# Patient Record
Sex: Female | Born: 1970 | Race: Asian | Hispanic: No | Marital: Married | Smoking: Never smoker
Health system: Southern US, Community
[De-identification: ages and names within clinical notes are randomized; demographics above are authoritative.]

## PROBLEM LIST (undated history)

## (undated) DIAGNOSIS — I10 Essential (primary) hypertension: Secondary | ICD-10-CM

---

## 2019-01-19 ENCOUNTER — Emergency Department (HOSPITAL_COMMUNITY): Payer: BC Managed Care – PPO

## 2019-01-19 ENCOUNTER — Encounter (HOSPITAL_COMMUNITY): Payer: Self-pay | Admitting: Family Medicine

## 2019-01-19 ENCOUNTER — Observation Stay (HOSPITAL_COMMUNITY)
Admission: EM | Admit: 2019-01-19 | Discharge: 2019-01-20 | Disposition: A | Discharge status: Home / Self Care | Payer: BC Managed Care – PPO | Attending: Internal Medicine | Admitting: Internal Medicine

## 2019-01-19 ENCOUNTER — Other Ambulatory Visit: Payer: Self-pay

## 2019-01-19 DIAGNOSIS — D649 Anemia, unspecified: Principal | ICD-10-CM | POA: Diagnosis present

## 2019-01-19 DIAGNOSIS — I10 Essential (primary) hypertension: Secondary | ICD-10-CM | POA: Diagnosis present

## 2019-01-19 DIAGNOSIS — Z20828 Contact with and (suspected) exposure to other viral communicable diseases: Secondary | ICD-10-CM | POA: Diagnosis not present

## 2019-01-19 DIAGNOSIS — R002 Palpitations: Secondary | ICD-10-CM | POA: Diagnosis not present

## 2019-01-19 DIAGNOSIS — N920 Excessive and frequent menstruation with regular cycle: Secondary | ICD-10-CM | POA: Diagnosis present

## 2019-01-19 HISTORY — DX: Essential (primary) hypertension: I10

## 2019-01-19 LAB — CBC
HCT: 22.7 % — ABNORMAL LOW (ref 36.0–46.0)
Hemoglobin: 5.4 g/dL — CL (ref 12.0–15.0)
MCH: 14.2 pg — ABNORMAL LOW (ref 26.0–34.0)
MCHC: 23.8 g/dL — ABNORMAL LOW (ref 30.0–36.0)
MCV: 59.9 fL — ABNORMAL LOW (ref 80.0–100.0)
Platelets: 557 10*3/uL — ABNORMAL HIGH (ref 150–400)
RBC: 3.79 MIL/uL — ABNORMAL LOW (ref 3.87–5.11)
RDW: 23.1 % — ABNORMAL HIGH (ref 11.5–15.5)
WBC: 8.8 10*3/uL (ref 4.0–10.5)
nRBC: 0.2 % (ref 0.0–0.2)

## 2019-01-19 LAB — COMPREHENSIVE METABOLIC PANEL
ALT: 13 U/L (ref 0–44)
AST: 15 U/L (ref 15–41)
Albumin: 3.7 g/dL (ref 3.5–5.0)
Alkaline Phosphatase: 40 U/L (ref 38–126)
Anion gap: 11 (ref 5–15)
BUN: 11 mg/dL (ref 6–20)
CO2: 21 mmol/L — ABNORMAL LOW (ref 22–32)
Calcium: 8.7 mg/dL — ABNORMAL LOW (ref 8.9–10.3)
Chloride: 104 mmol/L (ref 98–111)
Creatinine, Ser: 0.45 mg/dL (ref 0.44–1.00)
GFR calc Af Amer: >60 mL/min (ref >60)
GFR calc non Af Amer: >60 mL/min (ref >60)
Glucose, Bld: 111 mg/dL — ABNORMAL HIGH (ref 70–99)
Potassium: 3.5 mmol/L (ref 3.5–5.1)
Sodium: 136 mmol/L (ref 135–145)
Total Bilirubin: 0.6 mg/dL (ref 0.3–1.2)
Total Protein: 7 g/dL (ref 6.5–8.1)

## 2019-01-19 LAB — PREPARE RBC (CROSSMATCH)

## 2019-01-19 LAB — RETICULOCYTES
Immature Retic Fract: 29.6 % — ABNORMAL HIGH (ref 2.3–15.9)
RBC.: 3.72 MIL/uL — ABNORMAL LOW (ref 3.87–5.11)
Retic Count, Absolute: 68.1 10*3/uL (ref 19.0–186.0)
Retic Ct Pct: 1.8 % (ref 0.4–3.1)

## 2019-01-19 LAB — SARS CORONAVIRUS 2 (TAT 6-24 HRS): SARS Coronavirus 2: NEGATIVE

## 2019-01-19 LAB — TROPONIN I (HIGH SENSITIVITY)
Troponin I (High Sensitivity): <2 ng/L (ref <18)
Troponin I (High Sensitivity): <2 ng/L (ref <18)

## 2019-01-19 LAB — ABO/RH: ABO/RH(D): O POS

## 2019-01-19 LAB — IRON AND TIBC
Iron: 9 ug/dL — ABNORMAL LOW (ref 28–170)
Saturation Ratios: 2 % — ABNORMAL LOW (ref 10.4–31.8)
TIBC: 500 ug/dL — ABNORMAL HIGH (ref 250–450)
UIBC: 491 ug/dL

## 2019-01-19 LAB — FOLATE: Folate: 14.5 ng/mL (ref >5.9)

## 2019-01-19 LAB — FERRITIN: Ferritin: 2 ng/mL — ABNORMAL LOW (ref 11–307)

## 2019-01-19 LAB — VITAMIN B12: Vitamin B-12: 419 pg/mL (ref 180–914)

## 2019-01-19 LAB — POC OCCULT BLOOD, ED: Fecal Occult Bld: NEGATIVE

## 2019-01-19 MED ORDER — ONDANSETRON HCL 4 MG/2ML IJ SOLN: 4.0000 mg | Freq: Four times a day (QID) | INTRAMUSCULAR | Status: DC | PRN

## 2019-01-19 MED ORDER — SODIUM CHLORIDE 0.9% FLUSH: 3.0000 mL | INTRAVENOUS | Status: DC | PRN

## 2019-01-19 MED ORDER — SODIUM CHLORIDE 0.9% FLUSH: 3.0000 mL | Freq: Two times a day (BID) | INTRAVENOUS | Status: DC

## 2019-01-19 MED ORDER — ACETAMINOPHEN 650 MG RE SUPP: 650.0000 mg | Freq: Four times a day (QID) | RECTAL | Status: DC | PRN

## 2019-01-19 MED ORDER — SODIUM CHLORIDE 0.9 % IV SOLN: 250.0000 mL | INTRAVENOUS | Status: DC | PRN

## 2019-01-19 MED ORDER — ONDANSETRON HCL 4 MG PO TABS: 4.0000 mg | ORAL_TABLET | Freq: Four times a day (QID) | ORAL | Status: DC | PRN

## 2019-01-19 MED ORDER — POLYETHYLENE GLYCOL 3350 17 G PO PACK: 17.0000 g | PACK | Freq: Every day | ORAL | Status: DC | PRN

## 2019-01-19 MED ORDER — SODIUM CHLORIDE 0.9 % IV SOLN
10.0000 mL/h | Freq: Once | INTRAVENOUS | Status: AC
  Administered 2019-01-19: 10 mL/h via INTRAVENOUS

## 2019-01-19 MED ORDER — ACETAMINOPHEN 325 MG PO TABS: 650.0000 mg | ORAL_TABLET | Freq: Four times a day (QID) | ORAL | Status: DC | PRN

## 2019-01-19 NOTE — ED Notes (Signed)
Electronic Blood Consent signed on the computer by the patient.

## 2019-01-19 NOTE — H&P (Signed)
History and Physical    Rita Green [id number] DOB: Dec 04, 1970 DOA: 01/19/2019  PCP: Leonard Downing, MD   Patient coming from: Home   Chief Complaint: Palpitations, low Hgb on outpatient labs   HPI: Rita Green is a 48 y.o. female with medical history significant for hypertension and menorrhagia, now presenting to the emergency department for evaluation of low hemoglobin on outpatient blood work.  Patient reports that she has developed palpitations with exertion over the past couple months and saw her PCP for this.  She was evaluated with routine blood work that revealed a hemoglobin in the 5 range.  She denies any history of melena, hematochezia, abdominal discomfort, change in bowel habits, or nausea.  She reports history of menorrhagia for which she was just recently placed on a different oral contraceptive.  She has not had any fevers, chills, cough, or shortness of breath.  She denies lightheadedness.  ED Course: Upon arrival to the ED, patient is found to be afebrile, saturating well on room air, and with stable blood pressure.  EKG features a sinus rhythm and chest x-ray is negative for edema or consolidation.  Troponin is undetectable.  Chemistry panel unremarkable.  Microcytic anemia with hemoglobin 5.4 and MCV 59.9, as well as thrombocytosis to 557,000.  Fecal occult blood testing negative.  CBC notable for COVID-19 testing is in process.  Type and screen was performed and 2 units of packed red blood cells were ordered for immediate transfusion.  Review of Systems:  All other systems reviewed and apart from HPI, are negative.  Past Medical History:  Diagnosis Date  . Hypertension     History reviewed. No pertinent surgical history.   reports that she has never smoked. She has never used smokeless tobacco. She reports previous alcohol use. She reports that she does not use drugs.  No Known Allergies  Family History  Problem Relation Age of Onset  .  Cancer Neg Hx      Prior to Admission medications   Not on File    Physical Exam: Vitals:   01/19/19 1901 01/19/19 1942 01/19/19 1945 01/19/19 1957  BP:  121/76 121/71 126/70  Pulse: 89 86 78 83  Resp: (!) 21 20 20  (!) 22  Temp:  98.5 F (36.9 C)  98.6 F (37 C)  TempSrc:  Oral  Oral  SpO2: 100%  100% 100%    Constitutional: NAD, calm, pale  Eyes: PERTLA, lids and conjunctivae normal ENMT: Mucous membranes are moist. Posterior pharynx clear of any exudate or lesions.   Neck: normal, supple, no masses, no thyromegaly Respiratory:  no wheezing, no crackles. Normal respiratory effort. No accessory muscle use.  Cardiovascular: S1 & S2 heard, regular rate and rhythm. No extremity edema.   Abdomen: No distension, no tenderness, soft. Bowel sounds normal.  Musculoskeletal: no clubbing / cyanosis. No joint deformity upper and lower extremities.    Skin: no significant rashes, lesions, ulcers. Warm, dry, well-perfused. Neurologic: No facial asymmetry. Sensation intact. Moving all extremities.  Psychiatric: Alert and oriented x 3. Pleasant, cooperative.     Labs on Admission: I have personally reviewed following labs and imaging studies  CBC: Recent Labs  Lab 01/19/19 1535  WBC 8.8  HGB 5.4*  HCT 22.7*  MCV 59.9*  PLT 123XX123*   Basic Metabolic Panel: Recent Labs  Lab 01/19/19 1535  NA 136  K 3.5  CL 104  CO2 21*  GLUCOSE 111*  BUN 11  CREATININE 0.45  CALCIUM  8.7*   GFR: CrCl cannot be calculated (Unknown ideal weight.). Liver Function Tests: Recent Labs  Lab 01/19/19 1535  AST 15  ALT 13  ALKPHOS 40  BILITOT 0.6  PROT 7.0  ALBUMIN 3.7   No results for input(s): LIPASE, AMYLASE in the last 168 hours. No results for input(s): AMMONIA in the last 168 hours. Coagulation Profile: No results for input(s): INR, PROTIME in the last 168 hours. Cardiac Enzymes: No results for input(s): CKTOTAL, CKMB, CKMBINDEX, TROPONINI in the last 168 hours. BNP (last 3  results) No results for input(s): PROBNP in the last 8760 hours. HbA1C: No results for input(s): HGBA1C in the last 72 hours. CBG: No results for input(s): GLUCAP in the last 168 hours. Lipid Profile: No results for input(s): CHOL, HDL, LDLCALC, TRIG, CHOLHDL, LDLDIRECT in the last 72 hours. Thyroid Function Tests: No results for input(s): TSH, T4TOTAL, FREET4, T3FREE, THYROIDAB in the last 72 hours. Anemia Panel: No results for input(s): VITAMINB12, FOLATE, FERRITIN, TIBC, IRON, RETICCTPCT in the last 72 hours. Urine analysis: No results found for: COLORURINE, APPEARANCEUR, LABSPEC, PHURINE, GLUCOSEU, HGBUR, BILIRUBINUR, KETONESUR, PROTEINUR, UROBILINOGEN, NITRITE, LEUKOCYTESUR Sepsis Labs: @LABRCNTIP (procalcitonin:4,lacticidven:4) )No results found for this or any previous visit (from the past 240 hour(s)).   Radiological Exams on Admission: Dg Chest 2 View  Result Date: 01/19/2019 CLINICAL DATA:  Cardiac palpitations and anemia EXAM: CHEST - 2 VIEW COMPARISON:  None. FINDINGS: Lungs are clear. Heart is upper normal in size with pulmonary vascularity normal. No adenopathy. No bone lesions. IMPRESSION: No edema or consolidation.  Heart upper normal in size. Electronically Signed   By: Lowella Grip III M.D.   On: 01/19/2019 16:30    EKG: Independently reviewed. Sinus rhythm, rate 82, QTc 425 ms.   Assessment/Plan   1. Symptomatic anemia  - Presents with 2 months of palpitations and low Hgb on outpatient blood work, found to have Hgb of 5.4 in ED with MCV 55.9  - Patient denies melena or hematochezia and FOBT is negative  - She reports hx of menorrhagia and was just started on an OCP for this  - 2 units RBC ordered from ED and first is transfusing  - Add anemia panel to initial blood draw, check post-transfusion CBC    2. Hypertension  - Patient unsure which antihypertensive she takes  - BP at goal, treat as-needed for now    PPE: Mask, face shield  DVT prophylaxis:  SCD's Code Status: Full  Family Communication: Discussed with patient  Consults called:  None  Admission status: Observation    Vianne Bulls, MD [facility] Pager [phone]  If 7PM-7AM, please contact night-coverage [email or web address] Password [email or web address]  01/19/2019, 8:01 PM

## 2019-01-19 NOTE — ED Provider Notes (Signed)
[facility] EMERGENCY DEPARTMENT Provider Note   CSN: [id number] Arrival date & time: 01/19/19  1510    History   Chief Complaint Chief Complaint  Patient presents with  . Abnormal Lab   HPI Rita Green is a 48 y.o. female with no significant past medical history how presents for palpitations. Seen by PCP yesterday and had labs done. Called today for anemia. Patient denies melena or hematochezia.  States she has had some mild intermittent dizziness over the last week.  She did recently's complete her menstrual cycle.  She does admit to heavy menstrual bleeding however her menstrual cycle been completed for about 1 week.  She has not had to take any medications for this.  States she does occasionally get palpitations however this has been occurring x1 year.  She was seen yesterday for her physical when she was noted to have this lab abnormality.  She does take medications for high blood pressure however does not any other additional chronic medical problems.  Denies headache, dizziness, lightheadedness, congestion, rhinorrhea, stiff neck chest pain, shortness of breath, reflux, dysuria, diarrhea, constipation.  Denies additional aggravating or alleviating factors.  She has a history of anemia however has never required a blood transfusion.  History obtained from patient, patient's son in room. No prior visits in Epic to compare. Patient refuses medical [place] interpreter and prefers her son to interpret.     PCP--Dr. Arelia Sneddon, [place]  HPI  No past medical history on file.  Patient Active Problem List   Diagnosis Date Noted  . Symptomatic anemia 01/19/2019    History reviewed   OB History   No obstetric history on file.      Home Medications    Prior to Admission medications   Not on File    Family History No family history on file.  Social History Social History   Tobacco Use  . Smoking status: Not on file  Substance Use Topics   . Alcohol use: Not on file  . Drug use: Not on file   Allergies   Patient has no known allergies.   Review of Systems Review of Systems  Constitutional: Negative.   HENT: Negative.   Respiratory: Negative.   Cardiovascular: Negative.   Gastrointestinal: Negative.   Genitourinary: Negative.   Musculoskeletal: Negative.   Skin: Negative.   Neurological: Negative.  Dizziness: Intermittent, None currently.  All other systems reviewed and are negative.  Physical Exam Updated Vital Signs BP 121/76   Pulse 86   Temp 98.5 F (36.9 C) (Oral)   Resp 20   LMP 12/28/2018 (Exact Date)   SpO2 100%   Physical Exam Vitals signs and nursing note reviewed. Exam conducted with a chaperone present.  Constitutional:      General: She is not in acute distress.    Appearance: She is well-developed. She is not ill-appearing, toxic-appearing or diaphoretic.  HENT:     Head: Normocephalic and atraumatic.  Eyes:     Pupils: Pupils are equal, round, and reactive to light.     Comments: Pale conjunctiva  Neck:     Musculoskeletal: Normal range of motion.  Cardiovascular:     Rate and Rhythm: Normal rate.     Pulses: Normal pulses.     Heart sounds: Normal heart sounds.  Pulmonary:     Effort: Pulmonary effort is normal. No respiratory distress.     Breath sounds: Normal breath sounds and air entry.  Abdominal:     General:  Bowel sounds are normal. There is no distension.     Palpations: Abdomen is soft.     Tenderness: There is no abdominal tenderness. There is no right CVA tenderness, left CVA tenderness, guarding or rebound.  Genitourinary:    Comments: Normal rectal tone. No stool in rectal vault. No gross blood Musculoskeletal: Normal range of motion.     Comments: Moves all 4 extremities without difficulty  Skin:    General: Skin is warm and dry.  Neurological:     Mental Status: She is alert.     Cranial Nerves: Cranial nerves are intact.     Sensory: Sensation is intact.      Motor: Motor function is intact.     Gait: Gait is intact.     Comments: Cn 2-12 grossly intact. No facial droop. Ambulatory in room without difficulty.      ED Treatments / Results  Labs (all labs ordered are listed, but only abnormal results are displayed) Labs Reviewed  COMPREHENSIVE METABOLIC PANEL - Abnormal; Notable for the following components:      Result Value   CO2 21 (*)    Glucose, Bld 111 (*)    Calcium 8.7 (*)    All other components within normal limits  CBC - Abnormal; Notable for the following components:   RBC 3.79 (*)    Hemoglobin 5.4 (*)    HCT 22.7 (*)    MCV 59.9 (*)    MCH 14.2 (*)    MCHC 23.8 (*)    RDW 23.1 (*)    Platelets 557 (*)    All other components within normal limits  SARS CORONAVIRUS 2 (TAT 6-24 HRS)  VITAMIN B12  FOLATE  IRON AND TIBC  FERRITIN  RETICULOCYTES  POC OCCULT BLOOD, ED  TYPE AND SCREEN  ABO/RH  PREPARE RBC (CROSSMATCH)  TROPONIN I (HIGH SENSITIVITY)  TROPONIN I (HIGH SENSITIVITY)    EKG None  Radiology Dg Chest 2 View  Result Date: 01/19/2019 CLINICAL DATA:  Cardiac palpitations and anemia EXAM: CHEST - 2 VIEW COMPARISON:  None. FINDINGS: Lungs are clear. Heart is upper normal in size with pulmonary vascularity normal. No adenopathy. No bone lesions. IMPRESSION: No edema or consolidation.  Heart upper normal in size. Electronically Signed   By: Lowella Grip III M.D.   On: 01/19/2019 16:30    Procedures .Critical Care Performed by: Nettie Elm, PA-C Authorized by: Nettie Elm, PA-C   Critical care provider statement:    Critical care time (minutes):  45   Critical care was necessary to treat or prevent imminent or life-threatening deterioration of the following conditions:  Circulatory failure (Anemia requiring transfusion)   Critical care was time spent personally by me on the following activities:  Discussions with consultants, evaluation of patient's response to treatment, examination of  patient, ordering and performing treatments and interventions, ordering and review of laboratory studies, ordering and review of radiographic studies, pulse oximetry, re-evaluation of patient's condition, obtaining history from patient or surrogate and review of old charts   (including critical care time)  Medications Ordered in ED Medications  0.9 %  sodium chloride infusion (has no administration in time range)   Initial Impression / Assessment and Plan / ED Course  I have reviewed the triage vital signs and the nursing notes.  Pertinent labs & imaging results that were available during my care of the patient were reviewed by me and considered in my medical decision making (see chart for details).  [place]  year old presents for evaluation of abnormal labs. Afebrile, non septic, non ill appearing. Patient with anemia of Hgb at 5.1. No gross blood on rectal exam. Hx of heavy menstrual bleeding which recently completed menstrual cycle. Denies active vaginal bleeding. Mild conjunctival pallor. Hx of intermittent palpitations over the last year. No current CP, SOB, hemoptysis. Labs obtained from triage.  CBC with anemia CMP with mildly elevated glucose Trop negative EKG without acute St.T changes. No STEMI DG chest without acute findings Occult negative DG chest without acute findings  Heart score 1- Acute, PERC negative, Wells criteria low risk. Low suspicion for ACS, PE, dissection, myocarditis, pericarditis, acute bacterial infectious process as cause of patient's palpitations.  She has had no arrhythmias or ectopy while in the emergency department.   Patient will need admission for anemia. Discussed risk vs benefit of transfusion. Patient voiced understanding and would like to processed with transfusion. Likely source from dysfunctional uterine bleeding however no current bleeding  CONSULT: Dr. Myna Hidalgo with [facility] who will evaluate patient for admission.   The patient appears reasonably  stabilized for admission considering the current resources, flow, and capabilities available in the ED at this time, and I doubt any other [facility] requiring further screening and/or treatment in the ED prior to admission.       Final Clinical Impressions(s) / ED Diagnoses   Final diagnoses:  Anemia, unspecified type    ED Discharge Orders    None       Kristal Perl A, PA-C 01/19/19 1946    Isla Pence, MD 01/19/19 1955

## 2019-01-19 NOTE — ED Triage Notes (Signed)
Pt here, sent by PCP for low hgb (5). Pt went to PCP for palpitations and was found in blood work done yesterday. Pt has not noticed any blood in stool.

## 2019-01-20 DIAGNOSIS — D649 Anemia, unspecified: Secondary | ICD-10-CM | POA: Diagnosis not present

## 2019-01-20 LAB — TYPE AND SCREEN
ABO/RH(D): O POS
Antibody Screen: NEGATIVE
Unit division: 0
Unit division: 0

## 2019-01-20 LAB — BASIC METABOLIC PANEL
Anion gap: 7 (ref 5–15)
BUN: 7 mg/dL (ref 6–20)
CO2: 22 mmol/L (ref 22–32)
Calcium: 8.5 mg/dL — ABNORMAL LOW (ref 8.9–10.3)
Chloride: 109 mmol/L (ref 98–111)
Creatinine, Ser: 0.46 mg/dL (ref 0.44–1.00)
GFR calc Af Amer: >60 mL/min (ref >60)
GFR calc non Af Amer: >60 mL/min (ref >60)
Glucose, Bld: 93 mg/dL (ref 70–99)
Potassium: 3.8 mmol/L (ref 3.5–5.1)
Sodium: 138 mmol/L (ref 135–145)

## 2019-01-20 LAB — CBC
HCT: 29.2 % — ABNORMAL LOW (ref 36.0–46.0)
Hemoglobin: 8.2 g/dL — ABNORMAL LOW (ref 12.0–15.0)
MCH: 18.1 pg — ABNORMAL LOW (ref 26.0–34.0)
MCHC: 28.1 g/dL — ABNORMAL LOW (ref 30.0–36.0)
MCV: 64.6 fL — ABNORMAL LOW (ref 80.0–100.0)
Platelets: 486 10*3/uL — ABNORMAL HIGH (ref 150–400)
RBC: 4.52 MIL/uL (ref 3.87–5.11)
RDW: 28.9 % — ABNORMAL HIGH (ref 11.5–15.5)
WBC: 8.6 10*3/uL (ref 4.0–10.5)
nRBC: 0 % (ref 0.0–0.2)

## 2019-01-20 LAB — BPAM RBC
Blood Product Expiration Date: 202012232359
Blood Product Expiration Date: 202012232359
ISSUE DATE / TIME: 202011171934
ISSUE DATE / TIME: 202011172202
Unit Type and Rh: 5100
Unit Type and Rh: 5100

## 2019-01-20 LAB — HIV ANTIBODY (ROUTINE TESTING W REFLEX): HIV Screen 4th Generation wRfx: NONREACTIVE

## 2019-01-20 MED ORDER — IRON (FERROUS SULFATE) 325 (65 FE) MG PO TABS: 1.0000 | ORAL_TABLET | Freq: Every day | ORAL | 1 refills | Status: DC

## 2019-01-20 NOTE — ED Notes (Signed)
Breakfast Ordered 

## 2019-01-20 NOTE — Discharge Summary (Addendum)
Physician Discharge Summary  [name] [name] [id number] DOB: 11-08-1970 DOA: 01/19/2019  PCP: Leonard Downing, MD  Admit date: 01/19/2019 Discharge date: 01/20/2019  Time spent: 45 minutes  Recommendations for Outpatient Follow-up:  Follow up with Dr Arelia Sneddon in 5-7 days for evaluation of Hg Follow up with OB-gyn as scheduled Take medications as prescribed.    Discharge Diagnoses:  Principal Problem:   Symptomatic anemia Active Problems:   Essential hypertension   Menorrhagia   Discharge Condition: stable  Diet recommendation: heart healthy  There were no vitals filed for this visit.  History of present illness:  Rita Green is a 48 y.o. female with medical history significant for hypertension and menorrhagia, presented  to the emergency department 11/17 for evaluation of low hemoglobin on outpatient blood work.  Patient reported that she had developed palpitations with exertion over the previous couple months and saw her PCP.  She was evaluated with routine blood work that revealed a hemoglobin in the 5 range.  She denied any history of melena, hematochezia, abdominal discomfort, change in bowel habits, or nausea.  She reported history of menorrhagia for which she was just recently prescribed a different oral contraceptive but had not started.   She denied fevers, chills, cough, or shortness of breath.  She denied lightheadedness  Hospital Course:  1. Symptomatic anemia  - Presented with 2 months of palpitations and low Hgb on outpatient blood work, found to have Hgb of 5.4 in ED with MCV 55.9. Patient denied melena or hematochezia and FOBT is negative. She reported hx of menorrhagia and was just prescribed a different OCP for this but has not started it. She was provided with 2 units RBC and Hg up to 8.2 at discharge. Anemia panel with RBC 3.72, Ferritin 2, iron 9, TIBC 500 saturation ratio 2. Will be discharged with iron supplement. Instructed to follow up with  PCP 5-7 days for Hg check.   2. Hypertension controlled. Home meds include lisinopril and HCTZ. Have instructed her to resume home meds at discharge. SBP 143 upon standing.      Discharge Exam: Vitals:   01/20/19 0815 01/20/19 0830  BP: 111/70 (!) 116/99  Pulse:    Resp: (!) 22 17  Temp:    SpO2:      General: awake alert no acute distress Cardiovascular: rrr no mgr no LE edema Respiratory: normal effort BS clear bilaterally no wheeze Abd: non-distended non-tender +BS no guarding or rebounding  Discharge Instructions   Discharge Instructions     Call MD for:  difficulty breathing, headache or visual disturbances   Complete by: As directed    Call MD for:  extreme fatigue   Complete by: As directed    Call MD for:  persistant dizziness or light-headedness   Complete by: As directed    Call MD for:  severe uncontrolled pain   Complete by: As directed    Call MD for:  temperature >100.4   Complete by: As directed    Diet - low sodium heart healthy   Complete by: As directed    Discharge instructions   Complete by: As directed    Follow up with dr Arelia Sneddon 7 days with cbc to check Hg Take medications as prescribed May return to work 11/21   Increase activity slowly   Complete by: As directed       Allergies as of 01/20/2019   No Known Allergies      Medication List  TAKE these medications    Brimonidine Tartrate 0.025 % Soln Place 1 drop into the right eye daily as needed (For dry eyes).   Iron (Ferrous Sulfate) 325 (65 Fe) MG Tabs Take 1 tablet by mouth daily.   lisinopril-hydrochlorothiazide 20-25 MG tablet Commonly known as: ZESTORETIC Take 1 tablet by mouth daily.   norethindrone 0.35 MG tablet Commonly known as: MICRONOR Take 1 tablet by mouth daily.   REDNESS RELIEF OP Place 1 drop into both eyes daily as needed (For redness in eyes).       No Known Allergies     The results of significant diagnostics from this hospitalization  (including imaging, microbiology, ancillary and laboratory) are listed below for reference.    Significant Diagnostic Studies: Dg Chest 2 View  Result Date: 01/19/2019 CLINICAL DATA:  Cardiac palpitations and anemia EXAM: CHEST - 2 VIEW COMPARISON:  None. FINDINGS: Lungs are clear. Heart is upper normal in size with pulmonary vascularity normal. No adenopathy. No bone lesions. IMPRESSION: No edema or consolidation.  Heart upper normal in size. Electronically Signed   By: Lowella Grip III M.D.   On: 01/19/2019 16:30    Microbiology: Recent Results (from the past 240 hour(s))  SARS CORONAVIRUS 2 (TAT 6-24 HRS) Nasopharyngeal Nasopharyngeal Swab     Status: None   Collection Time: 01/19/19  5:56 PM   Specimen: Nasopharyngeal Swab  Result Value Ref Range Status   SARS Coronavirus 2 NEGATIVE NEGATIVE Final    Comment: (NOTE) SARS-CoV-2 target nucleic acids are NOT DETECTED. The SARS-CoV-2 RNA is generally detectable in upper and lower respiratory specimens during the acute phase of infection. Negative results do not preclude SARS-CoV-2 infection, do not rule out co-infections with other pathogens, and should not be used as the sole basis for treatment or other patient management decisions. Negative results must be combined with clinical observations, patient history, and epidemiological information. The expected result is Negative. Fact Sheet for Patients: SugarRoll.be Fact Sheet for Healthcare Providers: https://www.woods-mathews.com/ This test is not yet approved or cleared by the [place] FDA and  has been authorized for detection and/or diagnosis of SARS-CoV-2 by FDA under an Emergency Use Authorization (EUA). This EUA will remain  in effect (meaning this test can be used) for the duration of the COVID-19 declaration under Section 56 4(b)(1) of the Act, 21 U.S.C. section 360bbb-3(b)(1), unless the authorization is terminated  or revoked sooner. Performed at [facility], [address], [place], [place] [address]      Labs: Basic Metabolic Panel: Recent Labs  Lab 01/19/19 1535 01/20/19 0341  NA 136 138  K 3.5 3.8  CL 104 109  CO2 21* 22  GLUCOSE 111* 93  BUN 11 7  CREATININE 0.45 0.46  CALCIUM 8.7* 8.5*   Liver Function Tests: Recent Labs  Lab 01/19/19 1535  AST 15  ALT 13  ALKPHOS 40  BILITOT 0.6  PROT 7.0  ALBUMIN 3.7   No results for input(s): LIPASE, AMYLASE in the last 168 hours. No results for input(s): AMMONIA in the last 168 hours. CBC: Recent Labs  Lab 01/19/19 1535 01/20/19 0341  WBC 8.8 8.6  HGB 5.4* 8.2*  HCT 22.7* 29.2*  MCV 59.9* 64.6*  PLT 557* 486*   Cardiac Enzymes: No results for input(s): CKTOTAL, CKMB, CKMBINDEX, TROPONINI in the last 168 hours. BNP: BNP (last 3 results) No results for input(s): BNP in the last 8760 hours.  ProBNP (last 3 results) No results for input(s): PROBNP in the  last 8760 hours.  CBG: No results for input(s): GLUCAP in the last 168 hours.     SignedRadene Gunning NP [facility] 01/20/2019, 10:38 AM

## 2019-01-20 NOTE — ED Notes (Signed)
Patient verbalizes understanding of discharge instructions. Opportunity for questioning and answers were provided. Armband removed by staff, pt discharged from ED. Ambulated out to lobby  

## 2019-01-20 NOTE — ED Notes (Signed)
Pt ambulatory in hallway with steady gait, no assistance. Pt reports no dizziness or palpitations, states she feels perfectly normal.

## 2019-05-14 ENCOUNTER — Ambulatory Visit: Payer: BC Managed Care – PPO | Attending: Internal Medicine

## 2019-05-14 DIAGNOSIS — Z23 Encounter for immunization: Secondary | ICD-10-CM

## 2019-05-14 NOTE — Progress Notes (Signed)
   Covid-19 Vaccination Clinic  Name:  Rita Green    MRN: [id number] DOB: 09/09/1970  05/14/2019  [name] was observed post Covid-19 immunization for 15 minutes without incident. She was provided with Vaccine Information Sheet and instruction to access the V-Safe system.   [name] was instructed to call 911 with any severe reactions post vaccine: Marland Kitchen Difficulty breathing  . Swelling of face and throat  . A fast heartbeat  . A bad rash all over body  . Dizziness and weakness   Immunizations Administered    Name Date Dose VIS Date Route   Pfizer COVID-19 Vaccine 05/14/2019 11:06 AM 0.3 mL 02/12/2019 Intramuscular   Manufacturer: [place]   Lot: [id number]   [place]: [id number]

## 2019-06-08 ENCOUNTER — Ambulatory Visit: Payer: BC Managed Care – PPO | Attending: Internal Medicine

## 2019-06-08 DIAGNOSIS — Z23 Encounter for immunization: Secondary | ICD-10-CM

## 2019-06-08 NOTE — Progress Notes (Signed)
   Covid-19 Vaccination Clinic  Name:  Madisen Zdrojewski    MRN: [id number] DOB: 1970/12/08  06/08/2019  Ms. Ensign was observed post Covid-19 immunization for 15 minutes without incident. She was provided with Vaccine Information Sheet and instruction to access the V-Safe system.   Ms. Burgardt was instructed to call 911 with any severe reactions post vaccine: Marland Kitchen Difficulty breathing  . Swelling of face and throat  . A fast heartbeat  . A bad rash all over body  . Dizziness and weakness   Immunizations Administered    Name Date Dose VIS Date Route   Pfizer COVID-19 Vaccine 06/08/2019  9:28 AM 0.3 mL 02/12/2019 Intramuscular   Manufacturer: [place]   Lot: [id number]   [place]: [id number]

## 2019-06-27 ENCOUNTER — Other Ambulatory Visit: Payer: Self-pay

## 2019-06-27 ENCOUNTER — Emergency Department (HOSPITAL_COMMUNITY)
Admission: EM | Admit: 2019-06-27 | Discharge: 2019-06-28 | Disposition: A | Discharge status: Home / Self Care | Payer: BC Managed Care – PPO | Attending: Emergency Medicine | Admitting: Emergency Medicine

## 2019-06-27 DIAGNOSIS — N939 Abnormal uterine and vaginal bleeding, unspecified: Secondary | ICD-10-CM | POA: Diagnosis not present

## 2019-06-27 DIAGNOSIS — I10 Essential (primary) hypertension: Secondary | ICD-10-CM | POA: Insufficient documentation

## 2019-06-27 DIAGNOSIS — Z79899 Other long term (current) drug therapy: Secondary | ICD-10-CM | POA: Insufficient documentation

## 2019-06-27 DIAGNOSIS — R102 Pelvic and perineal pain: Secondary | ICD-10-CM | POA: Diagnosis not present

## 2019-06-27 DIAGNOSIS — R52 Pain, unspecified: Secondary | ICD-10-CM

## 2019-06-27 LAB — COMPREHENSIVE METABOLIC PANEL
ALT: 23 U/L (ref 0–44)
AST: 17 U/L (ref 15–41)
Albumin: 3.8 g/dL (ref 3.5–5.0)
Alkaline Phosphatase: 54 U/L (ref 38–126)
Anion gap: 10 (ref 5–15)
BUN: 10 mg/dL (ref 6–20)
CO2: 21 mmol/L — ABNORMAL LOW (ref 22–32)
Calcium: 9 mg/dL (ref 8.9–10.3)
Chloride: 103 mmol/L (ref 98–111)
Creatinine, Ser: 0.54 mg/dL (ref 0.44–1.00)
GFR calc Af Amer: >60 mL/min (ref >60)
GFR calc non Af Amer: >60 mL/min (ref >60)
Glucose, Bld: 143 mg/dL — ABNORMAL HIGH (ref 70–99)
Potassium: 3.6 mmol/L (ref 3.5–5.1)
Sodium: 134 mmol/L — ABNORMAL LOW (ref 135–145)
Total Bilirubin: 0.5 mg/dL (ref 0.3–1.2)
Total Protein: 7 g/dL (ref 6.5–8.1)

## 2019-06-27 LAB — CBC
HCT: 39.1 % (ref 36.0–46.0)
Hemoglobin: 12.2 g/dL (ref 12.0–15.0)
MCH: 26.6 pg (ref 26.0–34.0)
MCHC: 31.2 g/dL (ref 30.0–36.0)
MCV: 85.4 fL (ref 80.0–100.0)
Platelets: 379 10*3/uL (ref 150–400)
RBC: 4.58 MIL/uL (ref 3.87–5.11)
RDW: 13.8 % (ref 11.5–15.5)
WBC: 13.5 10*3/uL — ABNORMAL HIGH (ref 4.0–10.5)
nRBC: 0 % (ref 0.0–0.2)

## 2019-06-27 LAB — TYPE AND SCREEN
ABO/RH(D): O POS
Antibody Screen: NEGATIVE

## 2019-06-27 LAB — I-STAT BETA HCG BLOOD, ED (MC, WL, AP ONLY): I-stat hCG, quantitative: <5 m[IU]/mL (ref <5)

## 2019-06-27 NOTE — ED Triage Notes (Signed)
Pt c/o seeing blood in the toilet today, and that the blood was running down her leg. Last menstrual period was two months ago.

## 2019-06-28 ENCOUNTER — Emergency Department (HOSPITAL_COMMUNITY): Payer: BC Managed Care – PPO

## 2019-06-28 LAB — WET PREP, GENITAL
Clue Cells Wet Prep HPF POC: NONE SEEN
Sperm: NONE SEEN
Trich, Wet Prep: NONE SEEN
Yeast Wet Prep HPF POC: NONE SEEN

## 2019-06-28 MED ORDER — IRON (FERROUS SULFATE) 325 (65 FE) MG PO TABS: 1.0000 | ORAL_TABLET | Freq: Every day | ORAL | 1 refills | Status: AC

## 2019-06-28 NOTE — Discharge Instructions (Addendum)
B?n c th? u?ng 1 ??n 2 vin Tylenol (350mg -1000mg  ty theo li?u l??ng) m?i 6 gi? khi c?n thi?t ?? gi?m ?au. Khng v??t qu 4000 mg Tylenol m?i ngy. N?u c?n ?au c?a b?n v?n cn, b?n c th? dng m?t li?u ibuprofen gi?a cc li?u Tylenol. Ti th??ng khuyn dng 400 ??n 600 mg ibuprofen m?i 6 gi?Marland Kitchen Hy dng mn ny cng v?i th?c ?n ?? trnh cc v?n ?? v? d? dy kh ch?u. Siu m cho th?y b?n b? u x? t? cung, chng ti nghi ng? ?i?u ny gp ph?n vo cc tri?u ch?ng c?a b?n. Chng ti mu?n b?n b? sung s?t ?? gip c?m mu.  Theo di v?i bc s? s?n ph? khoa ?? ?nh gi l?i cc tri?u ch?ng c?a b?n. Tr? l?i phng c?p c?u n?u c b?t k? d?u hi?u ho?c tri?u ch?ng lin quan no nh? s?t, nn m?a, ?au d? d?i khng ki?m sot ho?c m?t  th?c.  You can take 1 to 2 tablets of Tylenol (350mg -1000mg  depending on the dose) every 6 hours as needed for pain.  Do not exceed 4000 mg of Tylenol daily.  If your pain persists you can take a dose of ibuprofen in between doses of Tylenol.  I usually recommend 400 to 600 mg of ibuprofen every 6 hours.  Take this with food to avoid upset stomach issues.  Your ultrasound showed that you have fibroids, we suspect this is contributing to your symptoms.  We would like you to take an iron supplement to help given the bleeding.  Follow-up with your OB/GYN for reevaluation of your symptoms.  Return to the emergency department if any concerning signs or symptoms develop such as fevers, vomiting, severe uncontrolled pain, or loss of consciousness.

## 2019-06-28 NOTE — ED Provider Notes (Signed)
07:00: Patient care assumed from [facility] PA-C at change of shift pending [place].   49 yo female with a history of hypertension, anemia & menorrhagia who presents to the ED for vaginal bleeding. Bleeding heavily at 7PM last night with lower abdominal pressure.  Hx of heavy vaginal bleeding requiring transfusion.    Physical Exam  BP 115/64 (BP Location: Left Arm)   Pulse 74   Temp 98.1 F (36.7 C) (Oral)   Resp 16   SpO2 99%   Physical Exam Vitals and nursing note reviewed.  Constitutional:      General: She is not in acute distress.    Appearance: She is well-developed.  HENT:     Head: Normocephalic and atraumatic.  Eyes:     General:        Right eye: No discharge.        Left eye: No discharge.     Conjunctiva/sclera: Conjunctivae normal.  Neurological:     Mental Status: She is alert.     Comments: Clear speech.   Psychiatric:        Behavior: Behavior normal.        Thought Content: Thought content normal.     ED Course/Procedures     Results for orders placed or performed during the hospital encounter of 06/27/19  Wet prep, genital  Result Value Ref Range   Yeast Wet Prep HPF POC NONE SEEN NONE SEEN   Trich, Wet Prep NONE SEEN NONE SEEN   Clue Cells Wet Prep HPF POC NONE SEEN NONE SEEN   WBC, Wet Prep HPF POC MANY (A) NONE SEEN   Sperm NONE SEEN   Comprehensive metabolic panel  Result Value Ref Range   Sodium 134 (L) 135 - 145 mmol/L   Potassium 3.6 3.5 - 5.1 mmol/L   Chloride 103 98 - 111 mmol/L   CO2 21 (L) 22 - 32 mmol/L   Glucose, Bld 143 (H) 70 - 99 mg/dL   BUN 10 6 - 20 mg/dL   Creatinine, Ser 0.54 0.44 - 1.00 mg/dL   Calcium 9.0 8.9 - 10.3 mg/dL   Total Protein 7.0 6.5 - 8.1 g/dL   Albumin 3.8 3.5 - 5.0 g/dL   AST 17 15 - 41 U/L   ALT 23 0 - 44 U/L   Alkaline Phosphatase 54 38 - 126 U/L   Total Bilirubin 0.5 0.3 - 1.2 mg/dL   GFR calc non Af Amer >60 >60 mL/min   GFR calc Af Amer >60 >60 mL/min   Anion gap 10 5 - 15  CBC  Result Value Ref  Range   WBC 13.5 (H) 4.0 - 10.5 K/uL   RBC 4.58 3.87 - 5.11 MIL/uL   Hemoglobin 12.2 12.0 - 15.0 g/dL   HCT 39.1 36.0 - 46.0 %   MCV 85.4 80.0 - 100.0 fL   MCH 26.6 26.0 - 34.0 pg   MCHC 31.2 30.0 - 36.0 g/dL   RDW 13.8 11.5 - 15.5 %   Platelets 379 150 - 400 K/uL   nRBC 0.0 0.0 - 0.2 %  I-Stat beta hCG blood, ED  Result Value Ref Range   I-stat hCG, quantitative <5.0 <5 mIU/mL   Comment 3          Type and screen [place]  Result Value Ref Range   ABO/RH(D) O POS    Antibody Screen NEG    Sample Expiration      06/30/2019,2359 Performed at [facility]  Lab, [address], [place], [place] [address]    US PELVIC COMPLETE W TRANSVAGINAL AND TORSION R/O  Result Date: 06/28/2019 CLINICAL DATA:  Menorrhagia for 1 day EXAM: TRANSABDOMINAL AND TRANSVAGINAL ULTRASOUND OF PELVIS DOPPLER ULTRASOUND OF OVARIES TECHNIQUE: Both transabdominal and transvaginal ultrasound examinations of the pelvis were performed. Transabdominal technique was performed for global imaging of the pelvis including uterus, ovaries, adnexal regions, and pelvic cul-de-sac. It was necessary to proceed with endovaginal exam following the transabdominal exam to visualize the endometrium and adnexal structures. Color and duplex Doppler ultrasound was utilized to evaluate blood flow to the ovaries. COMPARISON:  None. FINDINGS: Uterus Measurements: 13.1 x 7.0 x 8.2 cm = volume: 393 mL. The uterus is diffusely heterogeneous with multiple uterine fibroids. Largest in the fundus measures 6.5 x 4.1 x 6.0 cm. Endometrium Thickness: . Nonvisualized, significant distortion due to multiple uterine fibroids. Right ovary Measurements: 2.5 x 2.4 x 1.6 cm = volume: 5.1 mL. Normal appearance/no adnexal mass. Left ovary Measurements: Not visualized Pulsed Doppler evaluation of the right ovary demonstrates normal low-resistance arterial and venous waveforms. Left ovary was not identified. Other findings There is a  circumscribed mass measuring 3.6 x 3.2 x 4.5 cm arising from the posterior margin of the cervix. The mass is hypoechoic, with marked internal vascularity. Differential includes polyp or fibroid. Correlation with speculum exam may be useful for direct visualization. IMPRESSION: 1. Heterogeneous enlargement of the uterus with multiple fibroids as above. 2. The endometrium is not well visualized due to the distortion from multiple fibroids. 3. Nonvisualization of the left ovary.  Right ovary is normal. 4. Circumscribed mass arising from the posterior margin of the cervix, which could reflect polyp or exophytic fibroid. Direct visual inspection is recommended. Electronically Signed   By: Randa Ngo M.D.   On: 06/28/2019 08:14     Procedures  MDM  Work-up reviewed : CBC: hgb/hct WNL. Mild leukocytosis likely nonspecific.  CMP: Mild hyperglycemia, hyponatremia, and mildly low bicarb. No significant derangement.  Wet prep: Many WBCs, no trich, BV, or yeast.  Preg test: Negative.   [place]: IMPRESSION: 1. Heterogeneous enlargement of the uterus with multiple fibroids as above. 2. The endometrium is not well visualized due to the distortion from multiple fibroids. 3. Nonvisualization of the left ovary.  Right ovary is normal. 4. Circumscribed mass arising from the posterior margin of the cervix, which could reflect polyp or exophytic fibroid. Direct visual inspection is recommended.  --> Regarding direct visual inspection, prior provider previously performed pelvic exam, will defer to OBGYN follow up as opposed to having patient endure second pelvic exam in same ED visit.   Fibroids likely contributory to her heavy bleeding.  Overall appears appropriate for discharge home. Discharge instructions prepared by prior provider. Given prescription for iron as she tells me she is not taking this anymore.   I discussed results, treatment plan, need for follow-up, and return precautions with the patient. Provided  opportunity for questions, patient confirmed understanding and is in agreement with plan.   Translator line utilized throughout Sales executive.         Amaryllis Dyke, PA-C 06/28/19 [id number]    Ward, Delice Bison, DO 07/02/19 2311

## 2019-06-28 NOTE — ED Provider Notes (Signed)
[facility] EMERGENCY DEPARTMENT Provider Note   CSN: [id number] Arrival date & time: 06/27/19  2136     History Chief Complaint  Patient presents with  . Vaginal Bleeding    Rita Green is a 49 y.o. female with history of hypertension, anemia, menorrhagia presenting for evaluation of acute onset, persistent pelvic pain and vaginal bleeding beginning at 7 PM last night.  She reports constant pressure in the pelvic region.  No aggravating or alleviating factors noted.  She denies fevers, chills, chest pain, shortness of breath, lightheadedness, syncope, nausea, vomiting, urinary symptoms, diarrhea, or constipation.  She states that the vaginal bleeding was initially heavy and she was passing clots but it has improved somewhat.  She reports she is concerned because she has a family history of uterine fibroids and ovarian cysts.  She states that she was previously on OCPs for her menorrhagia but had side effects so she has since switched to "the shot".  She is requesting pelvic ultrasound.  She has not tried anything for her symptoms.  The history is provided by the patient.       Past Medical History:  Diagnosis Date  . Hypertension     Patient Active Problem List   Diagnosis Date Noted  . Symptomatic anemia 01/19/2019  . Essential hypertension 01/19/2019  . Menorrhagia 01/19/2019    No past surgical history on file.   OB History   No obstetric history on file.     Family History  Problem Relation Age of Onset  . Cancer Neg Hx     Social History   Tobacco Use  . Smoking status: Never Smoker  . Smokeless tobacco: Never Used  Substance Use Topics  . Alcohol use: Not Currently  . Drug use: Never    Home Medications Prior to Admission medications   Medication Sig Start Date End Date Taking? Authorizing Provider  Brimonidine Tartrate 0.025 % SOLN Place 1 drop into the right eye daily as needed (For dry eyes).    [provider]    Iron, Ferrous Sulfate, 325 (65 Fe) MG TABS Take 1 tablet by mouth daily. 01/20/19   Black, Lezlie Octave, NP  lisinopril-hydrochlorothiazide (ZESTORETIC) 20-25 MG tablet Take 1 tablet by mouth daily.    [provider]  Naphazoline-Glycerin (REDNESS RELIEF OP) Place 1 drop into both eyes daily as needed (For redness in eyes).    [provider]  norethindrone (MICRONOR) 0.35 MG tablet Take 1 tablet by mouth daily.    [provider]    Allergies    Patient has no known allergies.  Review of Systems   Review of Systems  Constitutional: Negative for chills and fever.  Respiratory: Negative for shortness of breath.   Cardiovascular: Negative for chest pain.  Gastrointestinal: Negative for abdominal pain, nausea and vomiting.  Genitourinary: Positive for pelvic pain and vaginal bleeding. Negative for dysuria, frequency and hematuria.  All other systems reviewed and are negative.   Physical Exam Updated Vital Signs BP 115/64 (BP Location: Left Arm)   Pulse 74   Temp 98.1 F (36.7 C) (Oral)   Resp 16   SpO2 99%   Physical Exam Vitals and nursing note reviewed.  Constitutional:      General: She is not in acute distress.    Appearance: She is well-developed.  HENT:     Head: Normocephalic and atraumatic.  Eyes:     General:        Right eye: No discharge.  Left eye: No discharge.     Conjunctiva/sclera: Conjunctivae normal.  Neck:     Vascular: No JVD.     Trachea: No tracheal deviation.  Cardiovascular:     Rate and Rhythm: Normal rate.  Pulmonary:     Effort: Pulmonary effort is normal.  Abdominal:     General: Bowel sounds are normal. There is no distension.     Palpations: Abdomen is soft.     Tenderness: There is no abdominal tenderness. There is no right CVA tenderness, left CVA tenderness, guarding or rebound.  Genitourinary:    Comments: Examination performed in the presence of a chaperone.  No masses or lesions to the external  genitalia.  Scant amount of blood in the vaginal vault.  No cervical motion tenderness or adnexal tenderness. Skin:    General: Skin is warm and dry.     Findings: No erythema.  Neurological:     Mental Status: She is alert.  Psychiatric:        Behavior: Behavior normal.     ED Results / Procedures / Treatments   Labs (all labs ordered are listed, but only abnormal results are displayed) Labs Reviewed  COMPREHENSIVE METABOLIC PANEL - Abnormal; Notable for the following components:      Result Value   Sodium 134 (*)    CO2 21 (*)    Glucose, Bld 143 (*)    All other components within normal limits  CBC - Abnormal; Notable for the following components:   WBC 13.5 (*)    All other components within normal limits  WET PREP, GENITAL  I-STAT BETA HCG BLOOD, ED ([facility], [facility], [facility] ONLY)  TYPE AND SCREEN    EKG None  Radiology No results found.  Procedures Procedures (including critical care time)  Medications Ordered in ED Medications - No data to display  ED Course  I have reviewed the triage vital signs and the nursing notes.  Pertinent labs & imaging results that were available during my care of the patient were reviewed by me and considered in my medical decision making (see chart for details).    MDM Rules/Calculators/A&P                      Patient presenting for evaluation of vaginal bleeding.  She is afebrile, initially mildly tachycardic upon triage and mildly hypertensive with improvement in all vital signs on multiple subsequent reevaluations.  She is nontoxic in appearance.  Her abdomen is soft and nontender with no rebound or guarding.  Doubt acute surgical abdominal pathology.  Pelvic examination is reassuring with no cervical motion tenderness or neck tenderness, no palpable masses.  Lab work reviewed by me shows mild nonspecific leukocytosis, no anemia which is reassuring as she had an admission in November 2020 for symptomatic anemia requiring blood transfusion  due to menorrhagia.  No renal insufficiency, no metabolic derangements.   She is very concerned that she may have ovarian cysts or uterine fibroids causing her symptoms.  I explained to her that I have a low suspicion of this but she is adamant that she would like an ultrasound.  7:00 AM Signed out care to oncoming provider PA Petrucelli.  Pending wet prep and pelvic ultrasound.  If work-up is reassuring then she will likely be stable for discharge home with outpatient follow-up with OB/GYN.   Final Clinical Impression(s) / ED Diagnoses Final diagnoses:  Vaginal bleeding    Rx / DC Orders ED Discharge Orders  None       Renita Papa, PA-C 06/28/19 2204    Ward, Delice Bison, DO 07/02/19 2311

## 2020-04-28 ENCOUNTER — Other Ambulatory Visit: Payer: Self-pay | Admitting: Obstetrics & Gynecology

## 2020-04-28 DIAGNOSIS — D259 Leiomyoma of uterus, unspecified: Secondary | ICD-10-CM

## 2020-07-13 ENCOUNTER — Other Ambulatory Visit: Payer: Self-pay | Admitting: Obstetrics and Gynecology

## 2020-07-13 DIAGNOSIS — D500 Iron deficiency anemia secondary to blood loss (chronic): Secondary | ICD-10-CM

## 2020-07-24 ENCOUNTER — Other Ambulatory Visit: Payer: Self-pay

## 2020-07-24 ENCOUNTER — Encounter (HOSPITAL_COMMUNITY)
Admission: RE | Admit: 2020-07-24 | Discharge: 2020-07-24 | Disposition: A | Discharge status: Home / Self Care | Payer: BC Managed Care – PPO | Source: Ambulatory Visit | Attending: Obstetrics and Gynecology | Admitting: Obstetrics and Gynecology

## 2020-07-24 DIAGNOSIS — D500 Iron deficiency anemia secondary to blood loss (chronic): Secondary | ICD-10-CM | POA: Insufficient documentation

## 2020-07-24 MED ORDER — SODIUM CHLORIDE 0.9 % IV SOLN
500.0000 mg | INTRAVENOUS | Status: DC
  Administered 2020-07-24: 500 mg via INTRAVENOUS
  Filled 2020-07-24: qty 25

## 2020-07-24 MED ORDER — DIPHENHYDRAMINE HCL 25 MG PO CAPS
ORAL_CAPSULE | ORAL | Status: AC
  Filled 2020-07-24: qty 1

## 2020-07-24 MED ORDER — ACETAMINOPHEN 500 MG PO TABS
1000.0000 mg | ORAL_TABLET | Freq: Four times a day (QID) | ORAL | Status: DC | PRN
  Administered 2020-07-24: 1000 mg via ORAL

## 2020-07-24 MED ORDER — DIPHENHYDRAMINE HCL 25 MG PO CAPS
25.0000 mg | ORAL_CAPSULE | Freq: Four times a day (QID) | ORAL | Status: DC | PRN
  Administered 2020-07-24: 25 mg via ORAL

## 2020-07-24 MED ORDER — ACETAMINOPHEN 500 MG PO TABS
ORAL_TABLET | ORAL | Status: AC
  Filled 2020-07-24: qty 2

## 2020-07-24 NOTE — Discharge Instructions (Signed)
Iron Sucrose injection ?y l thu?c g? IRON SUCROSE l m?t ph?c h?p s?t. S?t ???c dng ?? t?o thnh cc t? bo h?ng c?u kh?e m?nh, chuyn ch? oxygen v d??ng ch?t ?i kh?p c? th?. Thu?c ny ???c dng ?? ?i?u tr? thi?u mu do thi?u s?t ? ng??i m?c b?nh th?n mn tnh. Thu?c ny c th? ???c dng cho nh?ng m?c ?ch khc; hy h?i ng??i cung c?p d?ch v? y t? ho?c d??c s? c?a mnh, n?u qu v? c th?c m?c. (CC) NHN HI?U PH? BI?N: Venofer Ti c?n ph?i bo cho ng??i cung c?p d?ch v? y t? c?a mnh ?i?u g tr??c khi dng thu?c ny? H? c?n bi?t li?u qu v? hi?n c b?t k? tnh tr?ng no sau ?y hay khng:  thi?u mu khng ph?i do l??ng s?t th?p  b?nh tim  hm l??ng s?t trong mu cao  b?nh th?n  b?nh gan  pha?n ??ng b?t th???ng ho??c di? ??ng v??i s?t  pha?n ??ng b?t th???ng ho??c di? ??ng v??i ca?c d??c ph?m kha?c  pha?n ??ng b?t th???ng ho??c di? ??ng v??i th??c ph?m, thu?c nhu?m, ho??c ch?t ba?o qua?n  ?ang c thai ho??c ??nh co? thai  ?ang cho con bu? Ti nn s? d?ng thu?c ny nh? th? no? Thu?c ny ?? truy?n vo t?nh m?ch. Thu?c ny ???c s? d?ng b?i chuyn vin y t? ? b?nh vi?n ho?c ? phng m?ch. Hy bn v?i bc s? nhi khoa c?a qu v? v? vi?c dng thu?c ny ? tr? em. Thu?c ny c th? ???c k toa cho tr? em ch? m?i 2 tu?i trong nh?ng tr??ng h?p ch?n l?c, nh?ng c?n ph?i th?n tr?ng. Qu li?u: N?u qu v? cho r?ng mnh ? dng qu nhi?u thu?c ny, th hy lin l?c v?i trung tm ki?m sot ch?t ??c ho?c phng c?p c?u ngay l?p t?c. L?U : Thu?c ny ch? dnh ring cho qu v?. Khng chia s? thu?c ny v?i nh?ng ng??i khc. N?u ti l? qun m?t li?u th sao? ?i?u quan tr?ng l khng nn b? l? li?u thu?c no. Hy lin l?c v?i bc s? ho?c chuyn vin y t? c?a mnh, n?u qu v? khng th? gi? ?ng cu?c h?n khm. Nh?ng g c th? t??ng tc v?i thu?c ny? Khng ???c dng thu?c ny cng v?i b?t k? th? no sau ?y:  deferoxamine  dimercaprol  cc ch? ph?m khc c?a ch?t s?t Thu?c ny c?ng c th?  t??ng tc v?i cc thu?c sau ?y:  chloramphenicol  deferasirox Danh sch ny c th? khng m t? ?? h?t cc t??ng tc c th? x?y ra. Hy ??a cho ng??i cung c?p d?ch v? y t? c?a mnh danh sch t?t c? cc thu?c, th?o d??c, cc thu?c khng c?n toa, ho?c cc ch? ph?m b? sung m qu v? dng. C?ng nn bo cho h? bi?t r?ng qu v? c ht thu?c, u?ng r??u, ho?c c s? d?ng ma ty tri php hay khng. Vi th? c th? t??ng tc v?i thu?c c?a qu v?. Ti c?n ph?i theo di ?i?u g trong khi dng thu?c ny? Hy ??n g?p bc s? ho?c chuyn vin y t? ?? theo di ??nh k? s?c kh?e c?a mnh. Hy bo cho bc s? ho?c chuyn vin y t?, n?u cc tri?u ch?ng c?a qu v? khng kh h?n, ho?c tr? nn n?ng h?n. Qu v? s? c?n ?i lm cc xt nghi?m mu ??nh k? trong khi qu v? dng thu?c ny. Qu v? c th? c?n ph?i tun theo m?t   ch? ?? ?n ??c bi?t. Hy ni v?i v?i bc s?. Nh?ng th?c ph?m c ch?a s?t bao g?m: ng? c?c nguyn h?t, cc lo?i tri cy s?y kh, cc lo?i ??u, ho?c ??u h lan (pea), cc lo?i rau l xanh, v ph? t?ng (gan, c?t). Ti c th? nh?n th?y nh?ng tc d?ng ph? no khi dng thu?c ny? Nh?ng tc d?ng ph? qu v? c?n ph?i bo cho bc s? ho?c chuyn vin y t? cng s?m cng t?t:  cc ph?n ?ng d? ?ng, ch?ng h?n nh? da b? m?n ??, ng?a, n?i my ?ay, s?ng ? m?t, mi, ho?c l??i  cc v?n ?? v? h h?p  thay ??i huy?t p  ho  tim ??p nhanh ho?c khng ??u  c?m th?y chong vng, ng?t x?u, b? t  s?t ho?c ?n l?nh  ?? b?ng da, ?? m? hi, ho?c c?m th?y nng  ?au ? kh?p ho?c c? b?p  co gi?t  s?ng ? m?t c chn ho?c bn chn  m?t m?i ho?c y?u ?t b?t th??ng Cc tc d?ng ph? khng c?n ph?i ch?m sc y t? (hy bo cho bc s? ho?c chuyn vin y t?, n?u cc tc d?ng ph? ny ti?p di?n ho?c gy phi?n toi):  tiu ch?y  c?m gic nh?c  ?au ??u  kch ?ng ? ch? tim  bu?n i ho?c i m?a  kh ch?u ? bao t?  c?m th?y m?t m?i Danh sch ny c th? khng m t? ?? h?t cc tc d?ng ph? c th? x?y ra. Xin g?i t?i bc s? c?a  mnh ?? ???c c? v?n chuyn mn v? cc tc d?ng ph?. Qu v? c th? t??ng trnh cc tc d?ng ph? cho FDA theo s? [phone]. Ti nn c?t gi? thu?c c?a mnh ? ?u? Thu?c ny ???c s? d?ng b?i chuyn vin y t? ? b?nh vi?n ho?c ? phng m?ch. Qu v? s? khng ???c c?p thu?c ny ?? c?t gi? t?i nh. L?U : ?y l b?n tm t?t. N c th? khng bao hm t?t c? thng tin c th? c. N?u qu v? th?c m?c v? thu?c ny, xin trao ??i v?i bc s?, d??c s?, ho?c ng??i cung c?p d?ch v? y t? c?a mnh.  2021 Elsevier/Gold Standard (2016-03-21 00:00:00)  

## 2020-08-01 ENCOUNTER — Other Ambulatory Visit: Payer: Self-pay

## 2020-08-01 ENCOUNTER — Encounter (HOSPITAL_COMMUNITY)
Admission: RE | Admit: 2020-08-01 | Discharge: 2020-08-01 | Disposition: A | Discharge status: Home / Self Care | Payer: BC Managed Care – PPO | Source: Ambulatory Visit | Attending: Obstetrics and Gynecology | Admitting: Obstetrics and Gynecology

## 2020-08-01 DIAGNOSIS — D500 Iron deficiency anemia secondary to blood loss (chronic): Secondary | ICD-10-CM

## 2020-08-01 MED ORDER — SODIUM CHLORIDE 0.9 % IV SOLN
500.0000 mg | INTRAVENOUS | Status: AC
  Administered 2020-08-01: 500 mg via INTRAVENOUS
  Filled 2020-08-01: qty 25

## 2020-08-01 MED ORDER — ACETAMINOPHEN 500 MG PO TABS
1000.0000 mg | ORAL_TABLET | Freq: Four times a day (QID) | ORAL | Status: AC | PRN
  Administered 2020-08-01: 1000 mg via ORAL

## 2020-08-01 MED ORDER — ACETAMINOPHEN 500 MG PO TABS
ORAL_TABLET | ORAL | Status: AC
  Filled 2020-08-01: qty 2

## 2020-08-01 MED ORDER — DIPHENHYDRAMINE HCL 25 MG PO CAPS
25.0000 mg | ORAL_CAPSULE | Freq: Four times a day (QID) | ORAL | Status: AC | PRN
  Administered 2020-08-01: 25 mg via ORAL

## 2020-08-01 MED ORDER — DIPHENHYDRAMINE HCL 25 MG PO CAPS
ORAL_CAPSULE | ORAL | Status: AC
  Filled 2020-08-01: qty 1

## 2020-08-01 NOTE — Progress Notes (Signed)
PT here today for second dose of IV venofer.  At completion of 30 min post infusion wait time pt C/O a feeling of needles sticking her legs and feet as well as swelling in feet and swelling of left arm and hand.  Pt stated this happened after the first dose of venofer as well but when she went home she was better.  I had the patient stay another 30 minutes to be observed.  At the end of that additional wait time she stated she felt better.  The swelling of her feet and left arm and hand was still noted, however it did appear better.  I encouraged her to seek medical care at the ER or call her MD if she started to experience anything new and or felt worse.  Pt verbalized understanding via her interpreter and was DC home with husband.

## 2021-04-23 ENCOUNTER — Other Ambulatory Visit (HOSPITAL_COMMUNITY): Payer: Self-pay | Admitting: Obstetrics & Gynecology

## 2021-04-23 DIAGNOSIS — D259 Leiomyoma of uterus, unspecified: Secondary | ICD-10-CM

## 2021-07-04 IMAGING — CR DG CHEST 2V
2 series · 2 of 2 positions shown · non-contrast
Comparison: None.

CLINICAL DATA: Cardiac palpitations and anemia

EXAM:
CHEST - 2 VIEW

[chest pa]
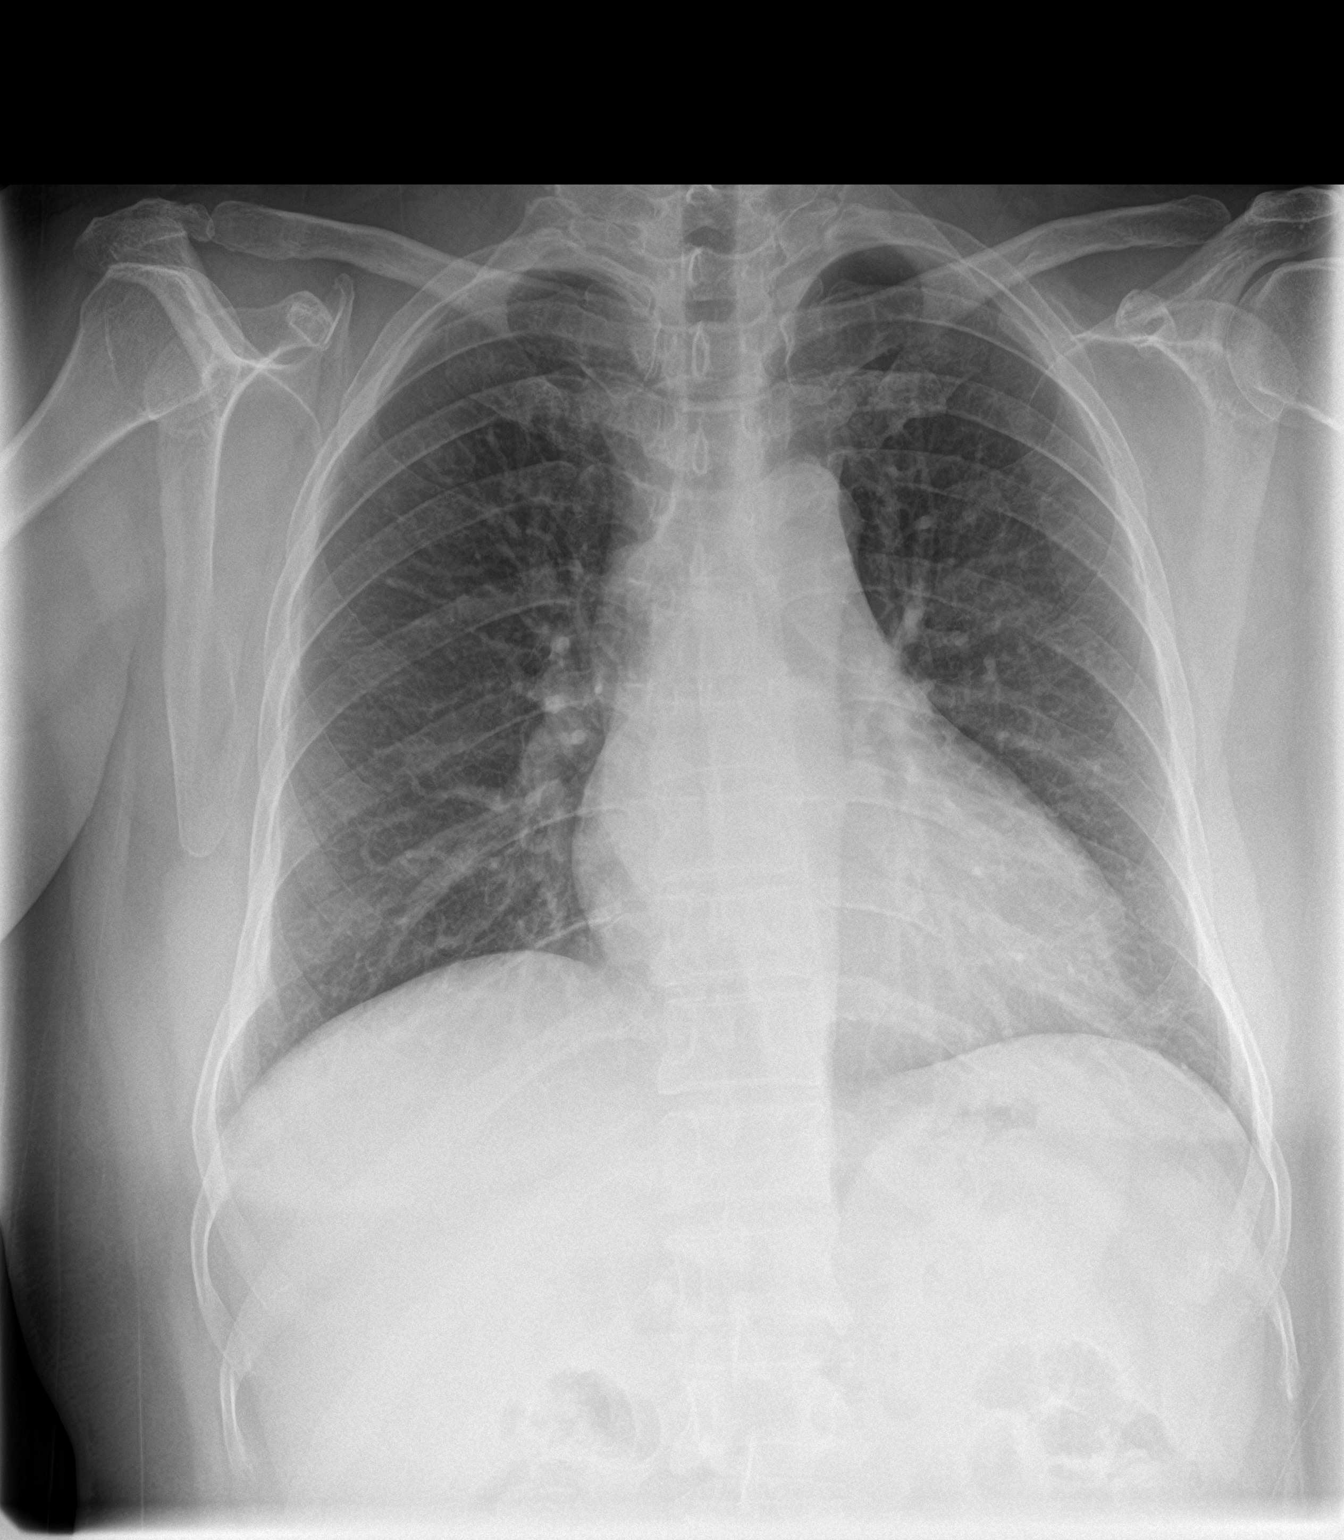

[chest lat]
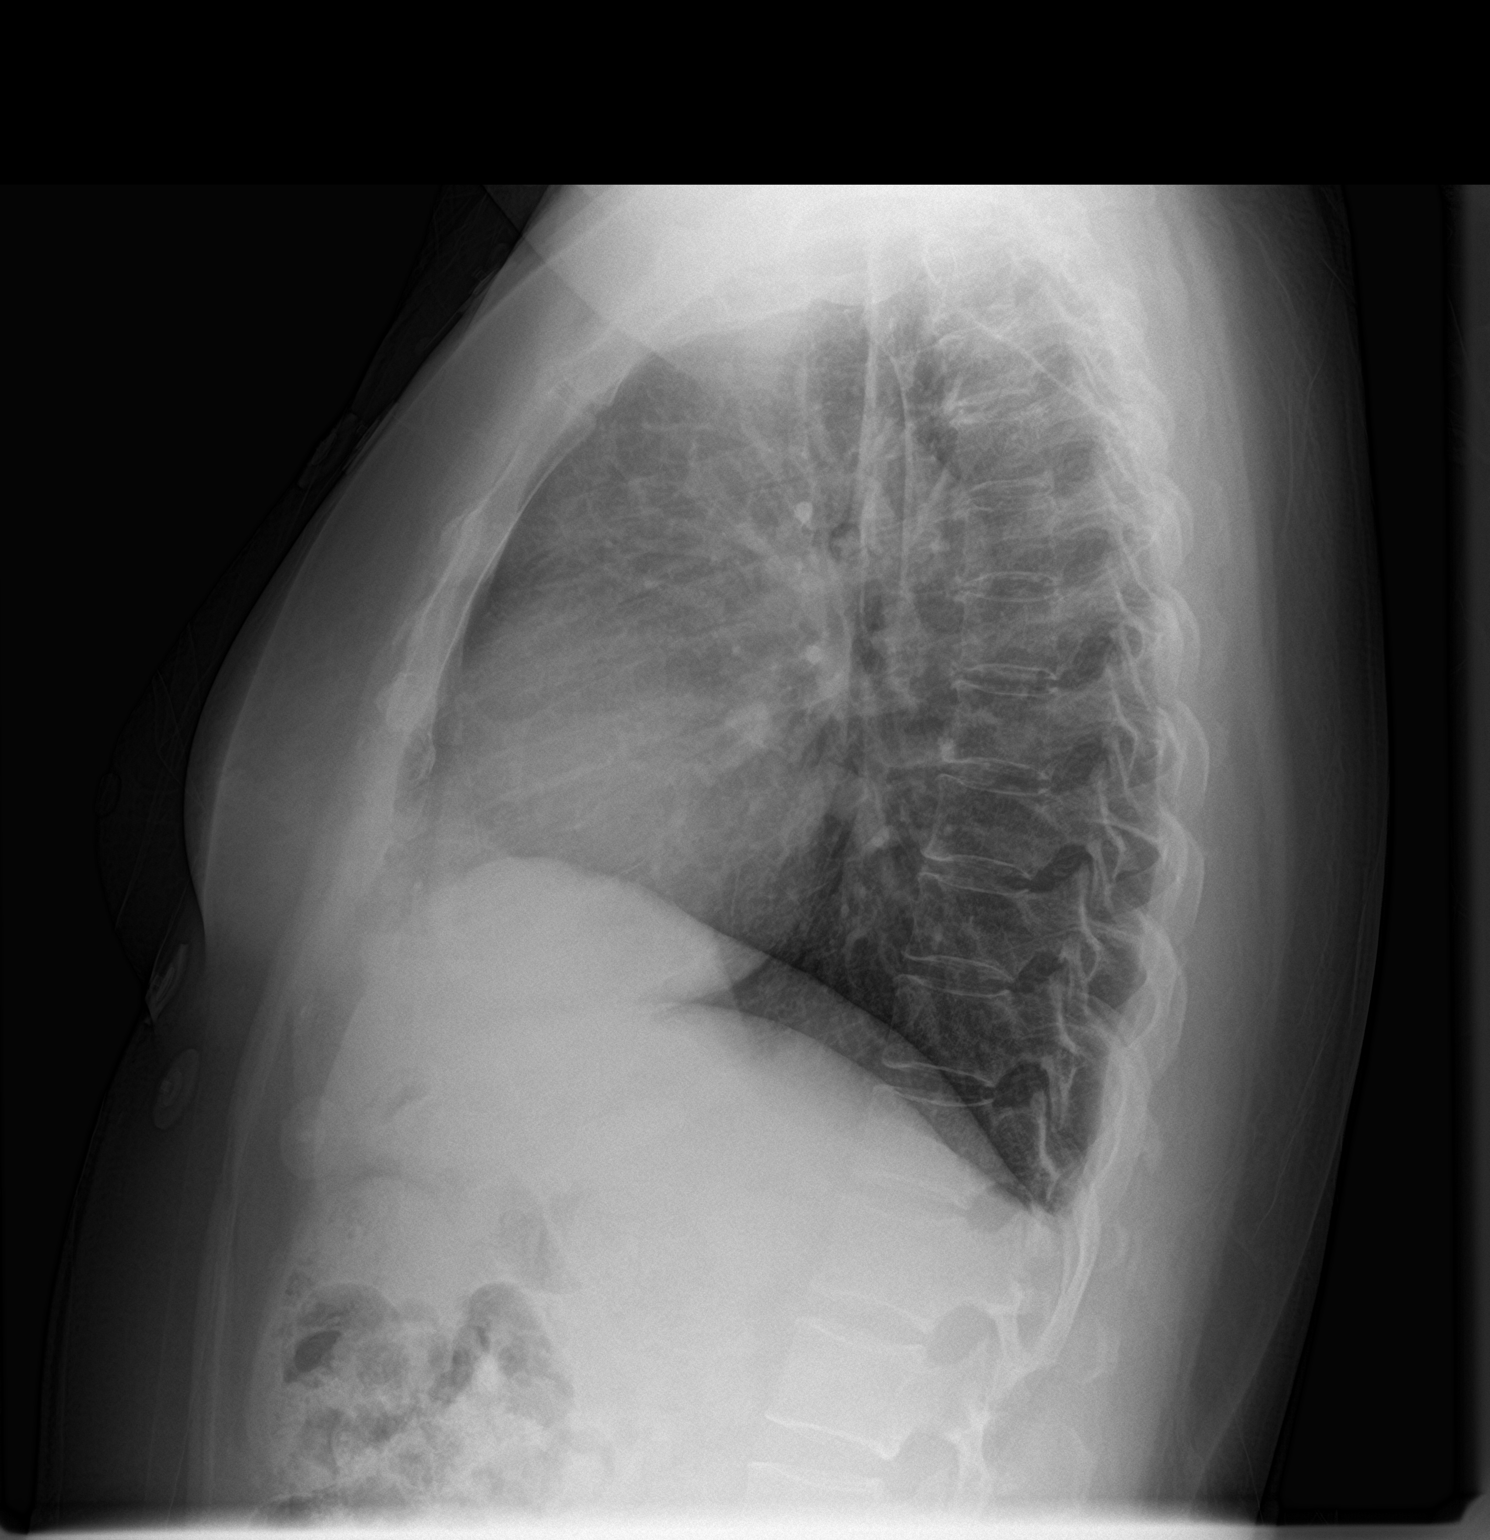

[2 of 2 positions shown; findings below may reference images not displayed]

FINDINGS: Lungs are clear. Heart is upper normal in size with pulmonary
vascularity normal. No adenopathy. No bone lesions.
IMPRESSION: No edema or consolidation.  Heart upper normal in size.

## 2021-12-11 IMAGING — US US PELVIS COMPLETE TRANSABD/TRANSVAG W DUPLEX
1 series · 13 of 25 positions shown · non-contrast
Comparison: None.

CLINICAL DATA: Menorrhagia for 1 day

EXAM:
TRANSABDOMINAL AND TRANSVAGINAL ULTRASOUND OF PELVIS
DOPPLER ULTRASOUND OF OVARIES
TECHNIQUE: Both transabdominal and transvaginal ultrasound examinations of the
pelvis were performed. Transabdominal technique was performed for
global imaging of the pelvis including uterus, ovaries, adnexal
regions, and pelvic cul-de-sac.
It was necessary to proceed with endovaginal exam following the
transabdominal exam to visualize the endometrium and adnexal
structures. Color and duplex Doppler ultrasound was utilized to
evaluate blood flow to the ovaries.

[Series 1: us pelvis (transabdominal only) · 70 acquisitions, 13 frames shown]
[im 1/70]
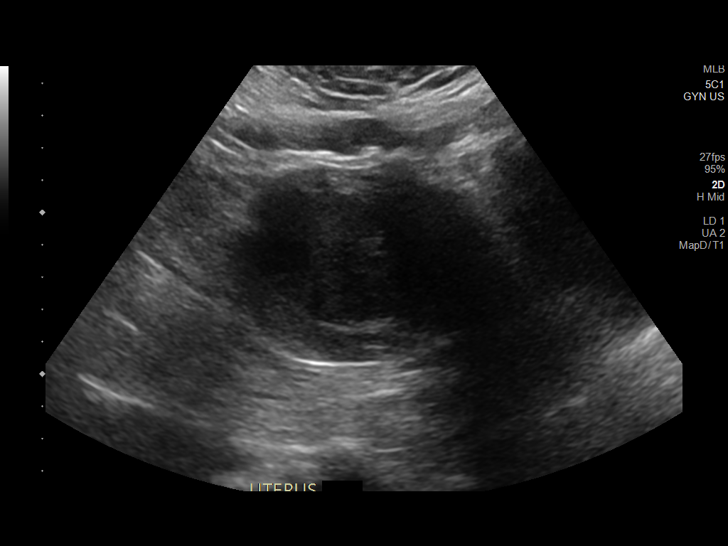
[im 6/70]
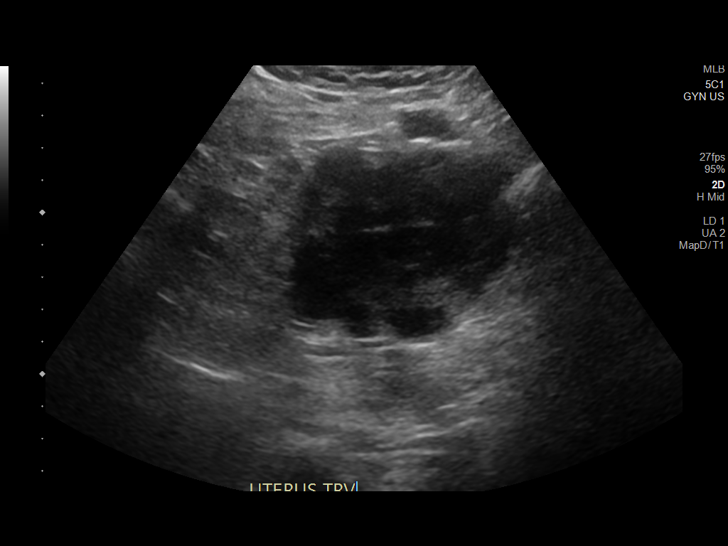
[im 12/70]
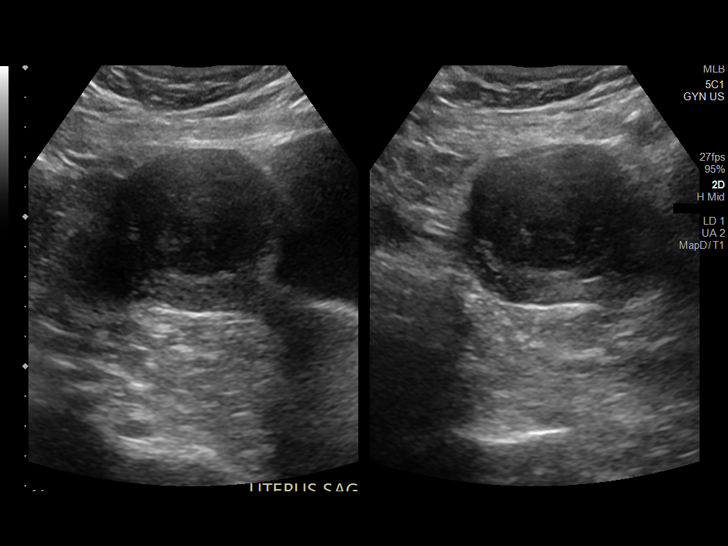
[im 18/70]
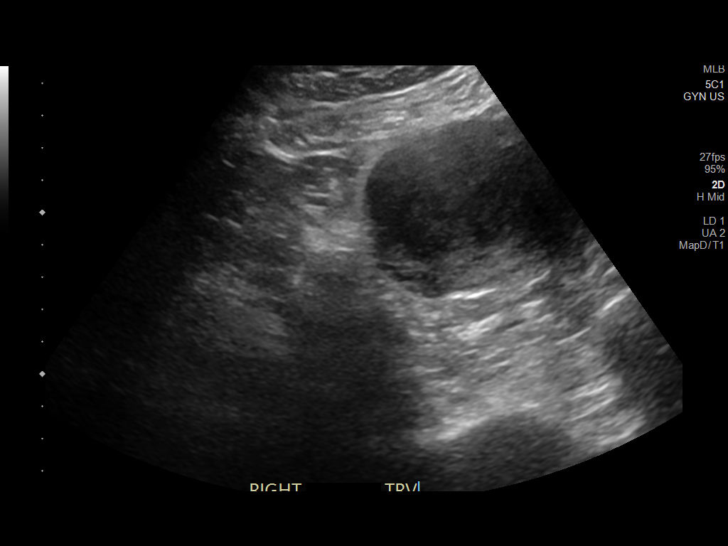
[im 24/70]
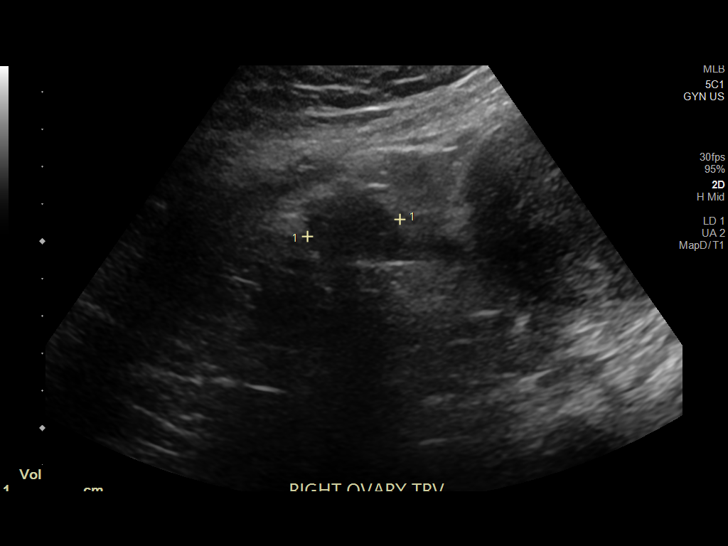
[im 29/70]
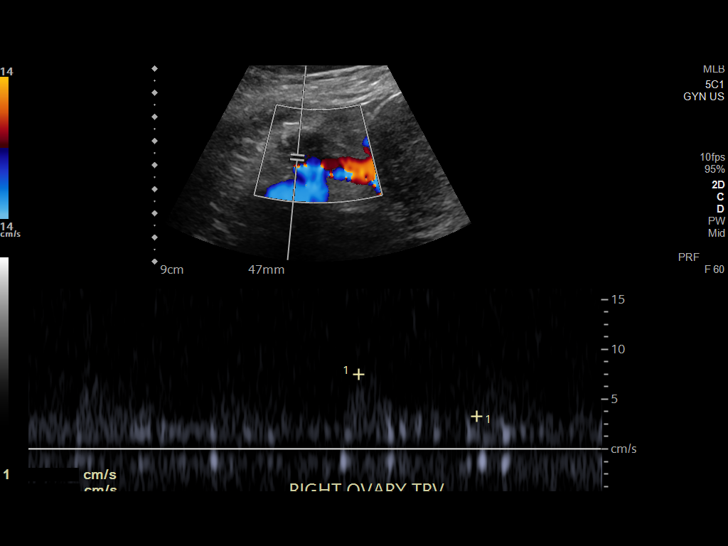
[im 35/70]
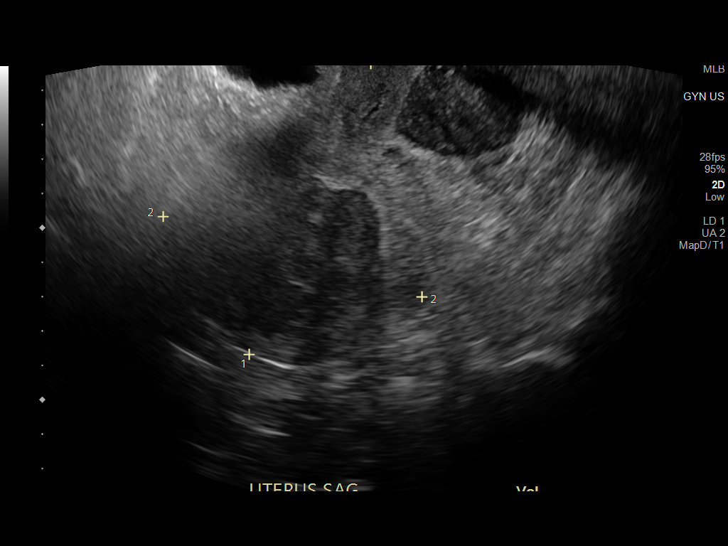
[im 41/70]
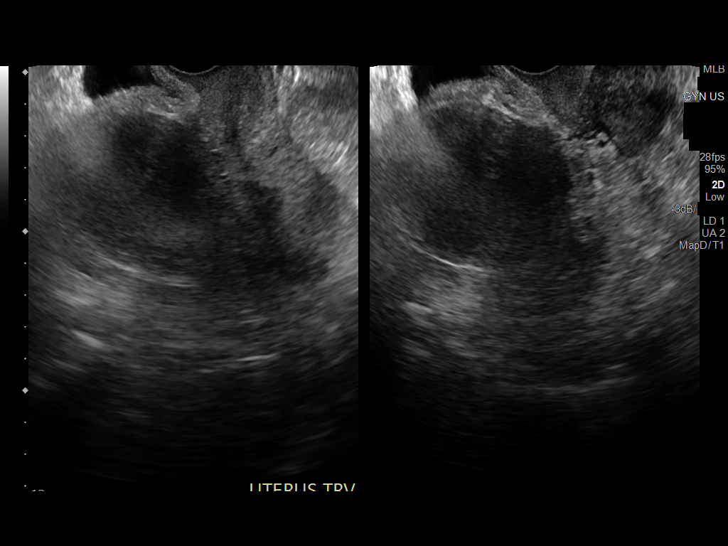
[im 47/70]
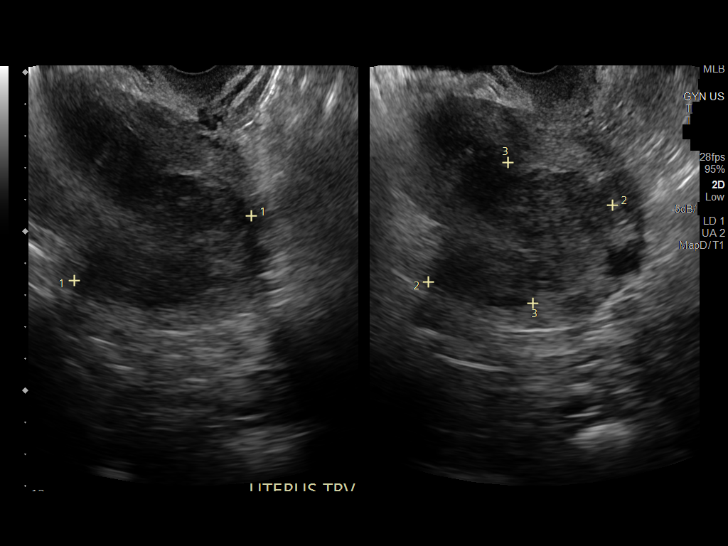
[im 52/70]
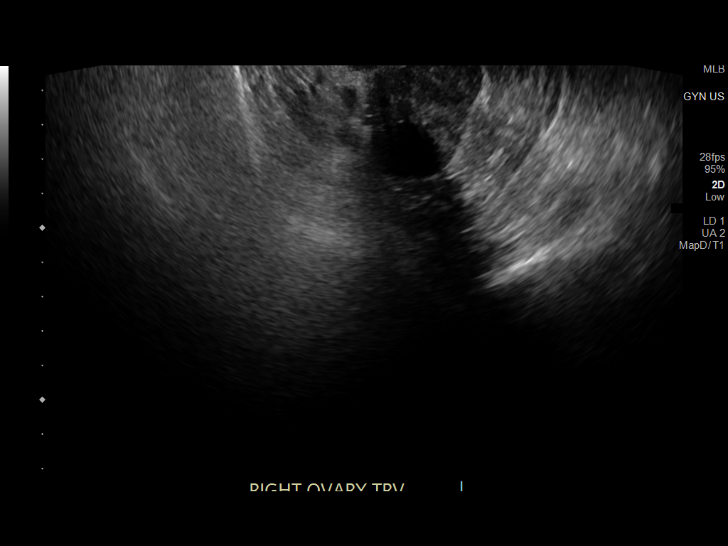
[im 58/70]
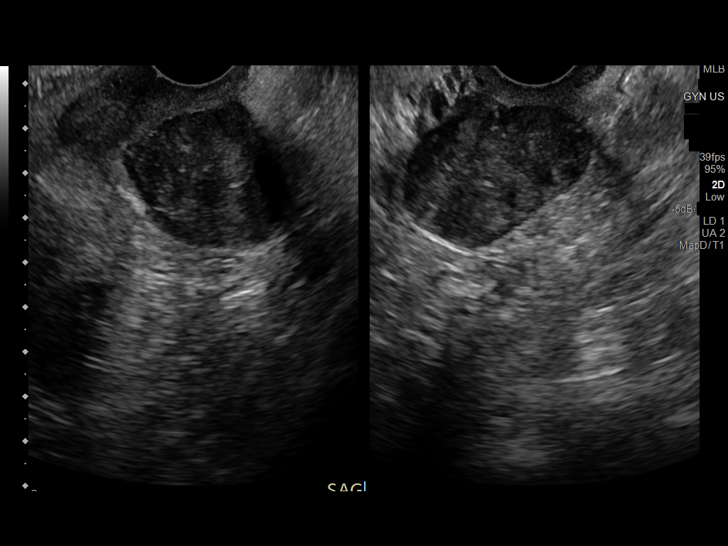
[im 64/70]
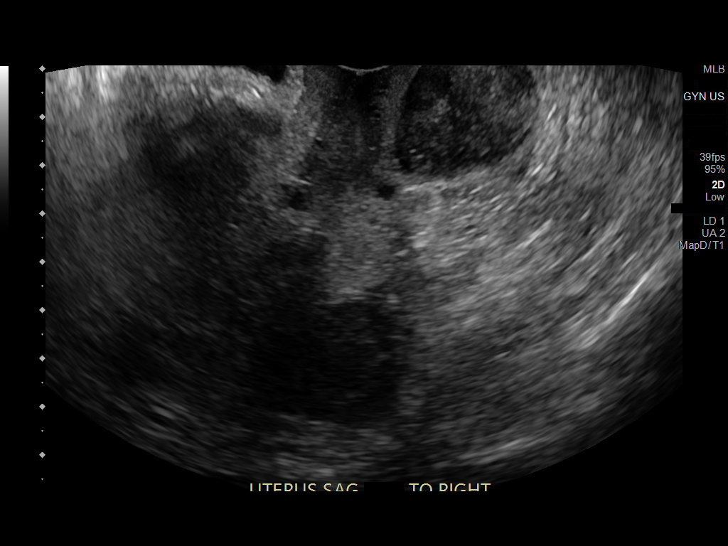
[im 70/70]
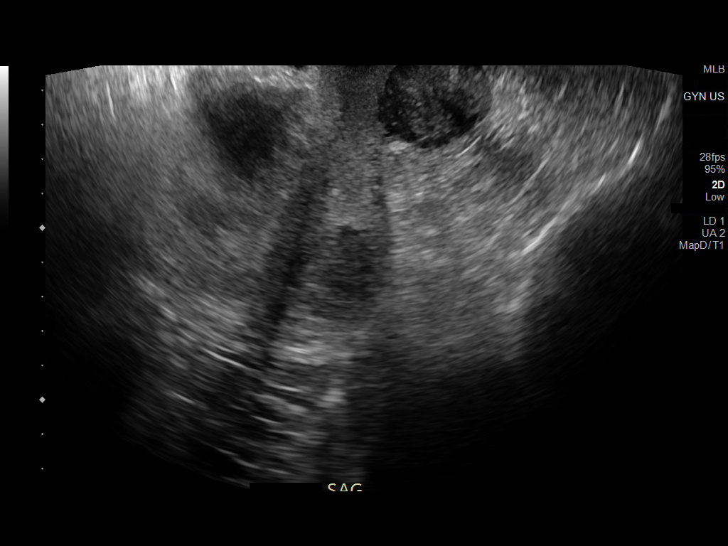

[13 of 25 positions shown; findings below may reference images not displayed]

FINDINGS: Uterus

Measurements: 13.1 x 7.0 x 8.2 cm = volume: 393 mL. The uterus is
diffusely heterogeneous with multiple uterine fibroids. Largest in
the fundus measures 6.5 x 4.1 x 6.0 cm.

Endometrium

Thickness: . Nonvisualized, significant distortion due to multiple
uterine fibroids.

Right ovary

Measurements: 2.5 x 2.4 x 1.6 cm = volume: 5.1 mL. Normal
appearance/no adnexal mass.

Left ovary

Measurements: Not visualized

Pulsed Doppler evaluation of the right ovary demonstrates normal
low-resistance arterial and venous waveforms. Left ovary was not
identified.

Other findings

There is a circumscribed mass measuring 3.6 x 3.2 x 4.5 cm arising
from the posterior margin of the cervix. The mass is hypoechoic,
with marked internal vascularity. Differential includes polyp or
fibroid. Correlation with speculum exam may be useful for direct
visualization.
IMPRESSION: 1. Heterogeneous enlargement of the uterus with multiple fibroids as
above.
2. The endometrium is not well visualized due to the distortion from
multiple fibroids.
3. Nonvisualization of the left ovary.  Right ovary is normal.
4. Circumscribed mass arising from the posterior margin of the
cervix, which could reflect polyp or exophytic fibroid. Direct
visual inspection is recommended.
